# Patient Record
Sex: Female | Born: 1950
Health system: Southern US, Community
[De-identification: ages and names within clinical notes are randomized; demographics above are authoritative.]

## PROBLEM LIST (undated history)

## (undated) DIAGNOSIS — E7800 Pure hypercholesterolemia, unspecified: Secondary | ICD-10-CM

## (undated) DIAGNOSIS — R569 Unspecified convulsions: Secondary | ICD-10-CM

## (undated) HISTORY — PX: ABDOMINAL HYSTERECTOMY: SHX81

---

## 2015-10-19 ENCOUNTER — Other Ambulatory Visit: Payer: Self-pay | Admitting: Internal Medicine

## 2015-10-19 DIAGNOSIS — Z13820 Encounter for screening for osteoporosis: Secondary | ICD-10-CM | POA: Diagnosis not present

## 2015-10-19 DIAGNOSIS — Z1389 Encounter for screening for other disorder: Secondary | ICD-10-CM | POA: Diagnosis not present

## 2015-10-19 DIAGNOSIS — Z1231 Encounter for screening mammogram for malignant neoplasm of breast: Secondary | ICD-10-CM

## 2015-10-19 DIAGNOSIS — Z0000 Encounter for general adult medical examination without abnormal findings: Secondary | ICD-10-CM | POA: Diagnosis not present

## 2015-10-19 DIAGNOSIS — G40909 Epilepsy, unspecified, not intractable, without status epilepticus: Secondary | ICD-10-CM | POA: Diagnosis not present

## 2015-10-19 DIAGNOSIS — Z79899 Other long term (current) drug therapy: Secondary | ICD-10-CM | POA: Diagnosis not present

## 2015-10-19 DIAGNOSIS — Z23 Encounter for immunization: Secondary | ICD-10-CM | POA: Diagnosis not present

## 2015-11-20 ENCOUNTER — Ambulatory Visit: Payer: Self-pay

## 2015-12-18 ENCOUNTER — Encounter (HOSPITAL_COMMUNITY): Payer: Self-pay

## 2015-12-18 ENCOUNTER — Emergency Department (HOSPITAL_COMMUNITY)
Admission: EM | Admit: 2015-12-18 | Discharge: 2015-12-18 | Disposition: A | Discharge status: Home / Self Care | Payer: Commercial Managed Care - HMO | Attending: Emergency Medicine | Admitting: Emergency Medicine

## 2015-12-18 ENCOUNTER — Emergency Department (HOSPITAL_COMMUNITY): Payer: Commercial Managed Care - HMO

## 2015-12-18 DIAGNOSIS — R072 Precordial pain: Secondary | ICD-10-CM | POA: Diagnosis not present

## 2015-12-18 DIAGNOSIS — F17200 Nicotine dependence, unspecified, uncomplicated: Secondary | ICD-10-CM | POA: Diagnosis not present

## 2015-12-18 DIAGNOSIS — R079 Chest pain, unspecified: Secondary | ICD-10-CM

## 2015-12-18 LAB — BASIC METABOLIC PANEL
ANION GAP: 9 (ref 5–15)
BUN: 6 mg/dL (ref 6–20)
CALCIUM: 9.2 mg/dL (ref 8.9–10.3)
CHLORIDE: 102 mmol/L (ref 101–111)
CO2: 21 mmol/L — AB (ref 22–32)
Creatinine, Ser: 0.62 mg/dL (ref 0.44–1.00)
GFR calc Af Amer: >60 mL/min (ref >60)
GFR calc non Af Amer: >60 mL/min (ref >60)
GLUCOSE: 91 mg/dL (ref 65–99)
Potassium: 4 mmol/L (ref 3.5–5.1)
Sodium: 132 mmol/L — ABNORMAL LOW (ref 135–145)

## 2015-12-18 LAB — CBC
HEMATOCRIT: 38.4 % (ref 36.0–46.0)
HEMOGLOBIN: 13 g/dL (ref 12.0–15.0)
MCH: 33.7 pg (ref 26.0–34.0)
MCHC: 33.9 g/dL (ref 30.0–36.0)
MCV: 99.5 fL (ref 78.0–100.0)
Platelets: 354 10*3/uL (ref 150–400)
RBC: 3.86 MIL/uL — ABNORMAL LOW (ref 3.87–5.11)
RDW: 13.2 % (ref 11.5–15.5)
WBC: 7.3 10*3/uL (ref 4.0–10.5)

## 2015-12-18 LAB — I-STAT TROPONIN, ED
Troponin i, poc: 0 ng/mL (ref 0.00–0.08)
Troponin i, poc: 0 ng/mL (ref 0.00–0.08)

## 2015-12-18 NOTE — ED Provider Notes (Signed)
[facility] DEPT Provider Note   CSN: [id number] Arrival date & time: 12/18/15  1549     History   Chief Complaint Chief Complaint  Patient presents with  . Chest Pain    HPI Catherine Ferguson is a 65 y.o. female.   Chest Pain   This is a new problem. The current episode started 1 to 2 hours ago. The problem occurs rarely. The problem has been resolved. The pain is associated with rest. The pain is present in the substernal region. The pain is moderate. The quality of the pain is described as brief, sharp and stabbing. The pain radiates to the left neck and left shoulder. Duration of episode(s) is 6 minutes. Pertinent negatives include no abdominal pain, no back pain, no cough, no diaphoresis, no fever, no lower extremity edema, no nausea, no palpitations, no shortness of breath, no syncope and no vomiting. She has tried nothing for the symptoms. Risk factors include smoking/tobacco exposure.  Her past medical history is significant for seizures (Hx of epilepsy, on multiple medications).    History reviewed. No pertinent past medical history.  There are no active problems to display for this patient.   Past Surgical History:  Procedure Laterality Date  . ABDOMINAL HYSTERECTOMY      OB History    No data available       Home Medications    Prior to Admission medications   Medication Sig Start Date End Date Taking? Authorizing Provider  divalproex (DEPAKOTE) 500 MG DR tablet Take 500-1,000 mg by mouth See admin instructions. 500 mg in the morning then 1,000 mg at 1600 (4 PM) then 1,000 mg at bedtime   Yes Historical Provider, MD  docusate sodium (COLACE) 100 MG capsule Take 100 mg by mouth daily.   Yes Historical Provider, MD  gabapentin (NEURONTIN) 300 MG capsule Take 300-600 mg by mouth See admin instructions. 300 mg in the morning then 600 mg at 1600 (4 PM) then 600 mg at bedtime   Yes Historical Provider, MD  ibuprofen (ADVIL,MOTRIN) 200 MG tablet Take 400 mg by  mouth every 6 (six) hours as needed for headache (for pain or migraines).   Yes Historical Provider, MD  PHENObarbital (LUMINAL) 60 MG tablet Take 60 mg by mouth 3 (three) times daily.   Yes Historical Provider, MD    Family History History reviewed. No pertinent family history.  Social History Social History  Substance Use Topics  . Smoking status: Current Every Day Smoker  . Smokeless tobacco: Never Used  . Alcohol use No     Allergies   Review of patient's allergies indicates no known allergies.   Review of Systems Review of Systems  Constitutional: Negative for chills, diaphoresis and fever.  HENT: Negative for ear pain and sore throat.   Eyes: Negative for pain and visual disturbance.  Respiratory: Negative for cough and shortness of breath.   Cardiovascular: Positive for chest pain. Negative for palpitations and syncope.  Gastrointestinal: Negative for abdominal pain, nausea and vomiting.  Genitourinary: Negative for dysuria and hematuria.  Musculoskeletal: Negative for arthralgias and back pain.  Skin: Negative for color change and rash.  Neurological: Positive for seizures (Hx of epilepsy, on multiple medications). Negative for syncope.  All other systems reviewed and are negative.    Physical Exam Updated Vital Signs BP 123/77 (BP Location: Right Arm)   Pulse 72   Temp 98.9 F (37.2 C) (Oral)   Resp 16   Ht 5' 4.5" (1.638 m)   Wt  66.2 kg   SpO2 97%   BMI 24.67 kg/m   Physical Exam  Constitutional: She is oriented to person, place, and time. She appears well-developed and well-nourished. No distress.  HENT:  Head: Normocephalic and atraumatic.  Eyes: Conjunctivae and EOM are normal. Pupils are equal, round, and reactive to light.  Neck: Neck supple.  Cardiovascular: Normal rate and regular rhythm.   Pulmonary/Chest: Effort normal and breath sounds normal. No respiratory distress.  Abdominal: Soft. There is no tenderness.  Musculoskeletal: She exhibits  no edema.  Neurological: She is alert and oriented to person, place, and time.  Skin: Skin is warm and dry.  Psychiatric: She has a normal mood and affect.  Nursing note and vitals reviewed.    ED Treatments / Results  Labs (all labs ordered are listed, but only abnormal results are displayed) Labs Reviewed  BASIC METABOLIC PANEL - Abnormal; Notable for the following:       Result Value   Sodium 132 (*)    CO2 21 (*)    All other components within normal limits  CBC - Abnormal; Notable for the following:    RBC 3.86 (*)    All other components within normal limits  I-STAT TROPOININ, ED  I-STAT TROPOININ, ED    EKG  EKG Interpretation  Date/Time:  Monday December 18 2015 15:52:18 EDT Ventricular Rate:  72 PR Interval:    QRS Duration: 99 QT Interval:  385 QTC Calculation: 422 R Axis:   31 Text Interpretation:  Sinus rhythm Probable left atrial enlargement Low voltage, precordial leads Nonspecific ST changes No previous ECGs available Confirmed by [employer] MD, ERIN ([id number]) on 12/18/2015 5:03:48 PM       Radiology Dg Chest 2 View  Result Date: 12/18/2015 CLINICAL DATA:  Stabbing left chest pain for 5-7 minutes today. EXAM: CHEST  2 VIEW COMPARISON:  None. FINDINGS: There is peribronchial thickening and mild pulmonary hyperexpansion. No consolidative process, pneumothorax or effusion. Heart size is normal. Pectus deformity is noted. IMPRESSION: No acute disease. Appearance of the chest suggestive of COPD. Electronically Signed   By: Inge Rise M.D.   On: 12/18/2015 16:56    Procedures Procedures (including critical care time)  Medications Ordered in ED Medications - No data to display   Initial Impression / Assessment and Plan / ED Course  I have reviewed the triage vital signs and the nursing notes.  Pertinent labs & imaging results that were available during my care of the patient were reviewed by me and considered in my medical decision making (see chart  for details).  Clinical Course   ASA 324 by EMS. No nitroglycerin given. Chest pain resolved.  Catherine Ferguson is 65 year old female with past medical history of epilepsy and tobacco abuse who presents for acute chest pain.  She has had approximately 5 episodes of similar chest pain in the last year and most recently today.  Vital signs are stable.  EKG obtained, demonstrates sinus rhythm with low voltages, nonspecific T-wave changes.  Labs obtained including CBC BMP and delta troponin.  Results are unremarkable.  Chest x-ray obtained, personally reviewed by me, demonstrates no acute cardiac disease, but is consistent with COPD.  Patient's HEART score is 4.  I recommended admission for further cardiac workup.  Patient states she would prefer to obtain workup through her outpatient doctor.  She has good follow-up with Dr. Lavone Orn.  Through shared decision making the discussed the risks and benefits of discharge versus in-hospital workup.  Patient understands  the risks, but would prefer an outpatient workup.  Patient is discharged home with strict return precautions, follow up instructions, and educational materials.   Final Clinical Impressions(s) / ED Diagnoses   Final diagnoses:  Chest pain, unspecified type    New Prescriptions New Prescriptions   No medications on file     Elveria Rising, MD 12/19/15 [place], MD 12/27/15 1626

## 2015-12-18 NOTE — ED Notes (Signed)
Pt stable, ambulatory, states understanding of discharge instructions 

## 2015-12-18 NOTE — ED Triage Notes (Signed)
Pt arrived via EMS c/o left sided stabbing chest pain lasting 5-7 minutes.  EMS gave 324 ASA.  Denies any current pain

## 2015-12-21 ENCOUNTER — Ambulatory Visit
Admission: RE | Admit: 2015-12-21 | Discharge: 2015-12-21 | Disposition: A | Discharge status: Home / Self Care | Payer: Commercial Managed Care - HMO | Source: Ambulatory Visit | Attending: Internal Medicine | Admitting: Internal Medicine

## 2015-12-21 DIAGNOSIS — Z1231 Encounter for screening mammogram for malignant neoplasm of breast: Secondary | ICD-10-CM

## 2015-12-28 ENCOUNTER — Other Ambulatory Visit: Payer: Self-pay | Admitting: Internal Medicine

## 2015-12-28 DIAGNOSIS — R928 Other abnormal and inconclusive findings on diagnostic imaging of breast: Secondary | ICD-10-CM

## 2016-01-04 ENCOUNTER — Ambulatory Visit
Admission: RE | Admit: 2016-01-04 | Discharge: 2016-01-04 | Disposition: A | Discharge status: Home / Self Care | Payer: Commercial Managed Care - HMO | Source: Ambulatory Visit | Attending: Internal Medicine | Admitting: Internal Medicine

## 2016-01-04 ENCOUNTER — Other Ambulatory Visit: Payer: Self-pay | Admitting: Internal Medicine

## 2016-01-04 DIAGNOSIS — R921 Mammographic calcification found on diagnostic imaging of breast: Secondary | ICD-10-CM | POA: Diagnosis not present

## 2016-01-04 DIAGNOSIS — Z23 Encounter for immunization: Secondary | ICD-10-CM | POA: Diagnosis not present

## 2016-01-04 DIAGNOSIS — N6489 Other specified disorders of breast: Secondary | ICD-10-CM | POA: Diagnosis not present

## 2016-01-04 DIAGNOSIS — R928 Other abnormal and inconclusive findings on diagnostic imaging of breast: Secondary | ICD-10-CM

## 2016-01-10 ENCOUNTER — Ambulatory Visit
Admission: RE | Admit: 2016-01-10 | Discharge: 2016-01-10 | Disposition: A | Discharge status: Home / Self Care | Payer: Commercial Managed Care - HMO | Source: Ambulatory Visit | Attending: Internal Medicine | Admitting: Internal Medicine

## 2016-01-10 ENCOUNTER — Other Ambulatory Visit: Payer: Self-pay | Admitting: Internal Medicine

## 2016-01-10 DIAGNOSIS — R921 Mammographic calcification found on diagnostic imaging of breast: Secondary | ICD-10-CM

## 2016-01-10 DIAGNOSIS — D241 Benign neoplasm of right breast: Secondary | ICD-10-CM | POA: Diagnosis not present

## 2016-08-29 DIAGNOSIS — H524 Presbyopia: Secondary | ICD-10-CM | POA: Diagnosis not present

## 2016-10-22 DIAGNOSIS — E7800 Pure hypercholesterolemia, unspecified: Secondary | ICD-10-CM | POA: Diagnosis not present

## 2016-10-22 DIAGNOSIS — G40909 Epilepsy, unspecified, not intractable, without status epilepticus: Secondary | ICD-10-CM | POA: Diagnosis not present

## 2016-10-22 DIAGNOSIS — Z0000 Encounter for general adult medical examination without abnormal findings: Secondary | ICD-10-CM | POA: Diagnosis not present

## 2016-10-22 DIAGNOSIS — Z13820 Encounter for screening for osteoporosis: Secondary | ICD-10-CM | POA: Diagnosis not present

## 2016-10-22 DIAGNOSIS — Z23 Encounter for immunization: Secondary | ICD-10-CM | POA: Diagnosis not present

## 2017-06-19 DIAGNOSIS — B079 Viral wart, unspecified: Secondary | ICD-10-CM | POA: Diagnosis not present

## 2017-06-19 DIAGNOSIS — L84 Corns and callosities: Secondary | ICD-10-CM | POA: Diagnosis not present

## 2017-09-08 DIAGNOSIS — H524 Presbyopia: Secondary | ICD-10-CM | POA: Diagnosis not present

## 2017-09-13 DIAGNOSIS — H524 Presbyopia: Secondary | ICD-10-CM | POA: Diagnosis not present

## 2017-09-13 DIAGNOSIS — H52223 Regular astigmatism, bilateral: Secondary | ICD-10-CM | POA: Diagnosis not present

## 2017-10-28 ENCOUNTER — Ambulatory Visit
Admission: RE | Admit: 2017-10-28 | Discharge: 2017-10-28 | Disposition: A | Discharge status: Home / Self Care | Payer: Commercial Managed Care - HMO | Source: Ambulatory Visit | Attending: Internal Medicine | Admitting: Internal Medicine

## 2017-10-28 ENCOUNTER — Other Ambulatory Visit: Payer: Self-pay | Admitting: Internal Medicine

## 2017-10-28 DIAGNOSIS — M5442 Lumbago with sciatica, left side: Secondary | ICD-10-CM | POA: Diagnosis not present

## 2017-10-28 DIAGNOSIS — Z0000 Encounter for general adult medical examination without abnormal findings: Secondary | ICD-10-CM | POA: Diagnosis not present

## 2017-10-28 DIAGNOSIS — M5136 Other intervertebral disc degeneration, lumbar region: Secondary | ICD-10-CM | POA: Diagnosis not present

## 2017-10-28 DIAGNOSIS — Z720 Tobacco use: Secondary | ICD-10-CM | POA: Diagnosis not present

## 2017-10-28 DIAGNOSIS — E7800 Pure hypercholesterolemia, unspecified: Secondary | ICD-10-CM | POA: Diagnosis not present

## 2017-10-28 DIAGNOSIS — L84 Corns and callosities: Secondary | ICD-10-CM | POA: Diagnosis not present

## 2017-10-28 DIAGNOSIS — Z1159 Encounter for screening for other viral diseases: Secondary | ICD-10-CM | POA: Diagnosis not present

## 2017-10-28 DIAGNOSIS — G40909 Epilepsy, unspecified, not intractable, without status epilepticus: Secondary | ICD-10-CM | POA: Diagnosis not present

## 2017-10-28 DIAGNOSIS — G8929 Other chronic pain: Secondary | ICD-10-CM | POA: Diagnosis not present

## 2017-10-28 DIAGNOSIS — Z1389 Encounter for screening for other disorder: Secondary | ICD-10-CM | POA: Diagnosis not present

## 2017-10-28 DIAGNOSIS — M5441 Lumbago with sciatica, right side: Secondary | ICD-10-CM | POA: Diagnosis not present

## 2017-10-29 ENCOUNTER — Ambulatory Visit: Payer: Medicare HMO | Admitting: Podiatry

## 2017-10-29 ENCOUNTER — Encounter: Payer: Self-pay | Admitting: Podiatry

## 2017-10-29 DIAGNOSIS — M216X2 Other acquired deformities of left foot: Secondary | ICD-10-CM

## 2017-10-29 DIAGNOSIS — Q828 Other specified congenital malformations of skin: Secondary | ICD-10-CM

## 2017-10-29 NOTE — Progress Notes (Signed)
This patient presents the office with chief complaint of a painful skin lesion on the bottom of her left foot.  She says the skin lesion has been present for approximately 2 months.  She says that it is painful walking and wearing her shoes.  She has a history of a previous skin lesion years ago.  She says that she has trim down her callus and wearing pads on her left forefoot.  She was referred to this office by her medical doctor.  She presents to the office for evaluation and treatment of this painful skin lesion.  General Appearance  Alert, conversant and in no acute stress.  Vascular  Dorsalis pedis and posterior tibial  pulses are palpable  bilaterally.  Capillary return is within normal limits  bilaterally. Temperature is within normal limits  bilaterally.  Neurologic  Senn-Weinstein monofilament wire test within normal limits  bilaterally. Muscle power within normal limits bilaterally.  Nails Thick disfigured discolored nails with subungual debris  from hallux to fifth toes bilaterally. No evidence of bacterial infection or drainage bilaterally.  Orthopedic  No limitations of motion of motion feet .  No crepitus or effusions noted.  No bony pathology or digital deformities noted. Plantarflexed fifth metatarsal head left foot.  Skin  normotropic skin  noted bilaterally.  No signs of infections or ulcers noted. Porokeratosis sub 5th metatarsal left foot. White tissue noted in the center of skin lesion.  Porokeratosis secondary to plantarflexed metatarsal left foot.  IE  Debride porokeratosis.  Discussed  this skin lesion with this patient . Told this patient. I believe she has developed a poor keratoses and not a wart.  I believe the white  tissue in the skin lesion is  scar tissue .  Adding was dispensed for this patient to take pressure off the skin lesion during gait.  We discussed surgery versus acid treatment if this problem persists.  RTC prn.   Gardiner Barefoot DPM

## 2017-12-02 DIAGNOSIS — Z23 Encounter for immunization: Secondary | ICD-10-CM | POA: Diagnosis not present

## 2017-12-30 DIAGNOSIS — Z79899 Other long term (current) drug therapy: Secondary | ICD-10-CM | POA: Diagnosis not present

## 2017-12-30 DIAGNOSIS — E7800 Pure hypercholesterolemia, unspecified: Secondary | ICD-10-CM | POA: Diagnosis not present

## 2018-01-16 ENCOUNTER — Other Ambulatory Visit: Payer: Self-pay

## 2018-01-16 ENCOUNTER — Emergency Department (HOSPITAL_COMMUNITY)
Admission: EM | Admit: 2018-01-16 | Discharge: 2018-01-16 | Disposition: A | Discharge status: Home / Self Care | Payer: Medicare HMO | Attending: Emergency Medicine | Admitting: Emergency Medicine

## 2018-01-16 ENCOUNTER — Encounter (HOSPITAL_COMMUNITY): Payer: Self-pay

## 2018-01-16 ENCOUNTER — Emergency Department (HOSPITAL_COMMUNITY): Payer: Medicare HMO

## 2018-01-16 DIAGNOSIS — Y9389 Activity, other specified: Secondary | ICD-10-CM | POA: Insufficient documentation

## 2018-01-16 DIAGNOSIS — R109 Unspecified abdominal pain: Secondary | ICD-10-CM | POA: Diagnosis not present

## 2018-01-16 DIAGNOSIS — S92301A Fracture of unspecified metatarsal bone(s), right foot, initial encounter for closed fracture: Secondary | ICD-10-CM

## 2018-01-16 DIAGNOSIS — S92511A Displaced fracture of proximal phalanx of right lesser toe(s), initial encounter for closed fracture: Secondary | ICD-10-CM | POA: Diagnosis not present

## 2018-01-16 DIAGNOSIS — F17200 Nicotine dependence, unspecified, uncomplicated: Secondary | ICD-10-CM | POA: Diagnosis not present

## 2018-01-16 DIAGNOSIS — S8991XA Unspecified injury of right lower leg, initial encounter: Secondary | ICD-10-CM | POA: Diagnosis not present

## 2018-01-16 DIAGNOSIS — S92341A Displaced fracture of fourth metatarsal bone, right foot, initial encounter for closed fracture: Secondary | ICD-10-CM | POA: Diagnosis not present

## 2018-01-16 DIAGNOSIS — Y92410 Unspecified street and highway as the place of occurrence of the external cause: Secondary | ICD-10-CM | POA: Diagnosis not present

## 2018-01-16 DIAGNOSIS — R079 Chest pain, unspecified: Secondary | ICD-10-CM | POA: Diagnosis not present

## 2018-01-16 DIAGNOSIS — M79661 Pain in right lower leg: Secondary | ICD-10-CM | POA: Diagnosis not present

## 2018-01-16 DIAGNOSIS — S92911A Unspecified fracture of right toe(s), initial encounter for closed fracture: Secondary | ICD-10-CM | POA: Insufficient documentation

## 2018-01-16 DIAGNOSIS — R0789 Other chest pain: Secondary | ICD-10-CM | POA: Diagnosis not present

## 2018-01-16 DIAGNOSIS — S299XXA Unspecified injury of thorax, initial encounter: Secondary | ICD-10-CM | POA: Diagnosis not present

## 2018-01-16 DIAGNOSIS — Y999 Unspecified external cause status: Secondary | ICD-10-CM | POA: Diagnosis not present

## 2018-01-16 DIAGNOSIS — S92331A Displaced fracture of third metatarsal bone, right foot, initial encounter for closed fracture: Secondary | ICD-10-CM | POA: Diagnosis not present

## 2018-01-16 DIAGNOSIS — R52 Pain, unspecified: Secondary | ICD-10-CM

## 2018-01-16 HISTORY — DX: Unspecified convulsions: R56.9

## 2018-01-16 HISTORY — DX: Pure hypercholesterolemia, unspecified: E78.00

## 2018-01-16 LAB — I-STAT CHEM 8, ED
BUN: 7 mg/dL — AB (ref 8–23)
CHLORIDE: 104 mmol/L (ref 98–111)
Calcium, Ion: 1.12 mmol/L — ABNORMAL LOW (ref 1.15–1.40)
Creatinine, Ser: 0.6 mg/dL (ref 0.44–1.00)
GLUCOSE: 97 mg/dL (ref 70–99)
HCT: 38 % (ref 36.0–46.0)
Hemoglobin: 12.9 g/dL (ref 12.0–15.0)
POTASSIUM: 3.4 mmol/L — AB (ref 3.5–5.1)
Sodium: 139 mmol/L (ref 135–145)
TCO2: 26 mmol/L (ref 22–32)

## 2018-01-16 MED ORDER — HYDROCODONE-ACETAMINOPHEN 5-325 MG PO TABS: 1.0000 | ORAL_TABLET | ORAL | 0 refills | Status: DC | PRN

## 2018-01-16 MED ORDER — HYDROCODONE-ACETAMINOPHEN 5-325 MG PO TABS
1.0000 | ORAL_TABLET | Freq: Once | ORAL | Status: AC
  Administered 2018-01-16: 1 via ORAL
  Filled 2018-01-16: qty 1

## 2018-01-16 NOTE — ED Notes (Signed)
Patient transported to X-ray 

## 2018-01-16 NOTE — ED Notes (Signed)
Took patient saline lock out patient is getting dressed °
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
## 2018-01-16 NOTE — Discharge Instructions (Signed)
Please do not bear weight on your right foot and use the crutches and postop shoe anytime you are getting around until you follow-up with orthopedics and are told differently.  Please use the pain medication sparingly and try to use only Tylenol or ibuprofen if possible.

## 2018-01-16 NOTE — ED Notes (Signed)
Pt given crutches ace wrap foot left and post op shoe
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
## 2018-01-16 NOTE — Progress Notes (Signed)
Orthopedic Tech Progress Note Patient Details:  Catherine Ferguson 03-Feb-1951 [id number]  Ortho Devices Type of Ortho Device: Crutches Ortho Device/Splint Interventions: Adjustment   Post Interventions Patient Tolerated: Well Instructions Provided: Adjustment of device, Care of device   Melony Overly T 01/16/2018, 12:32 PM

## 2018-01-21 DIAGNOSIS — M79674 Pain in right toe(s): Secondary | ICD-10-CM | POA: Diagnosis not present

## 2018-01-21 DIAGNOSIS — S92301A Fracture of unspecified metatarsal bone(s), right foot, initial encounter for closed fracture: Secondary | ICD-10-CM | POA: Diagnosis not present

## 2018-03-17 DIAGNOSIS — M79674 Pain in right toe(s): Secondary | ICD-10-CM | POA: Diagnosis not present

## 2018-03-17 DIAGNOSIS — S92301D Fracture of unspecified metatarsal bone(s), right foot, subsequent encounter for fracture with routine healing: Secondary | ICD-10-CM | POA: Diagnosis not present

## 2018-04-10 ENCOUNTER — Other Ambulatory Visit: Payer: Self-pay | Admitting: Internal Medicine

## 2018-04-10 DIAGNOSIS — Z13820 Encounter for screening for osteoporosis: Secondary | ICD-10-CM

## 2018-06-04 ENCOUNTER — Other Ambulatory Visit: Payer: Medicare HMO

## 2018-09-02 ENCOUNTER — Other Ambulatory Visit: Payer: Self-pay | Admitting: Pediatric Intensive Care

## 2018-09-02 DIAGNOSIS — Z20822 Contact with and (suspected) exposure to covid-19: Secondary | ICD-10-CM

## 2018-09-07 LAB — NOVEL CORONAVIRUS, NAA: SARS-CoV-2, NAA: NOT DETECTED

## 2018-09-10 DIAGNOSIS — H524 Presbyopia: Secondary | ICD-10-CM | POA: Diagnosis not present

## 2018-10-14 ENCOUNTER — Other Ambulatory Visit: Payer: Medicare HMO

## 2018-11-03 ENCOUNTER — Ambulatory Visit
Admission: RE | Admit: 2018-11-03 | Discharge: 2018-11-03 | Disposition: A | Discharge status: Home / Self Care | Payer: Medicare HMO | Source: Ambulatory Visit | Attending: Internal Medicine | Admitting: Internal Medicine

## 2018-11-03 ENCOUNTER — Other Ambulatory Visit: Payer: Self-pay

## 2018-11-03 DIAGNOSIS — Z13820 Encounter for screening for osteoporosis: Secondary | ICD-10-CM

## 2018-11-03 DIAGNOSIS — M810 Age-related osteoporosis without current pathological fracture: Secondary | ICD-10-CM | POA: Diagnosis not present

## 2018-11-03 DIAGNOSIS — Z780 Asymptomatic menopausal state: Secondary | ICD-10-CM | POA: Diagnosis not present

## 2018-11-06 DIAGNOSIS — Z1389 Encounter for screening for other disorder: Secondary | ICD-10-CM | POA: Diagnosis not present

## 2018-11-06 DIAGNOSIS — Z0000 Encounter for general adult medical examination without abnormal findings: Secondary | ICD-10-CM | POA: Diagnosis not present

## 2018-12-09 DIAGNOSIS — Z23 Encounter for immunization: Secondary | ICD-10-CM | POA: Diagnosis not present

## 2018-12-09 DIAGNOSIS — Z1211 Encounter for screening for malignant neoplasm of colon: Secondary | ICD-10-CM | POA: Diagnosis not present

## 2018-12-09 DIAGNOSIS — G40909 Epilepsy, unspecified, not intractable, without status epilepticus: Secondary | ICD-10-CM | POA: Diagnosis not present

## 2018-12-09 DIAGNOSIS — E7800 Pure hypercholesterolemia, unspecified: Secondary | ICD-10-CM | POA: Diagnosis not present

## 2018-12-09 DIAGNOSIS — M818 Other osteoporosis without current pathological fracture: Secondary | ICD-10-CM | POA: Diagnosis not present

## 2018-12-17 DIAGNOSIS — G40909 Epilepsy, unspecified, not intractable, without status epilepticus: Secondary | ICD-10-CM | POA: Diagnosis not present

## 2018-12-31 ENCOUNTER — Ambulatory Visit: Payer: Medicare HMO | Admitting: Podiatry

## 2019-02-15 IMAGING — CR DG LUMBAR SPINE 2-3V
3 series · 3 of 3 positions shown · non-contrast
Comparison: None.

CLINICAL DATA: Low back and bilateral hip pain greatest on the
right. No known injury

EXAM:
LUMBAR SPINE - 2-3 VIEW

[t l-spine a.p.]
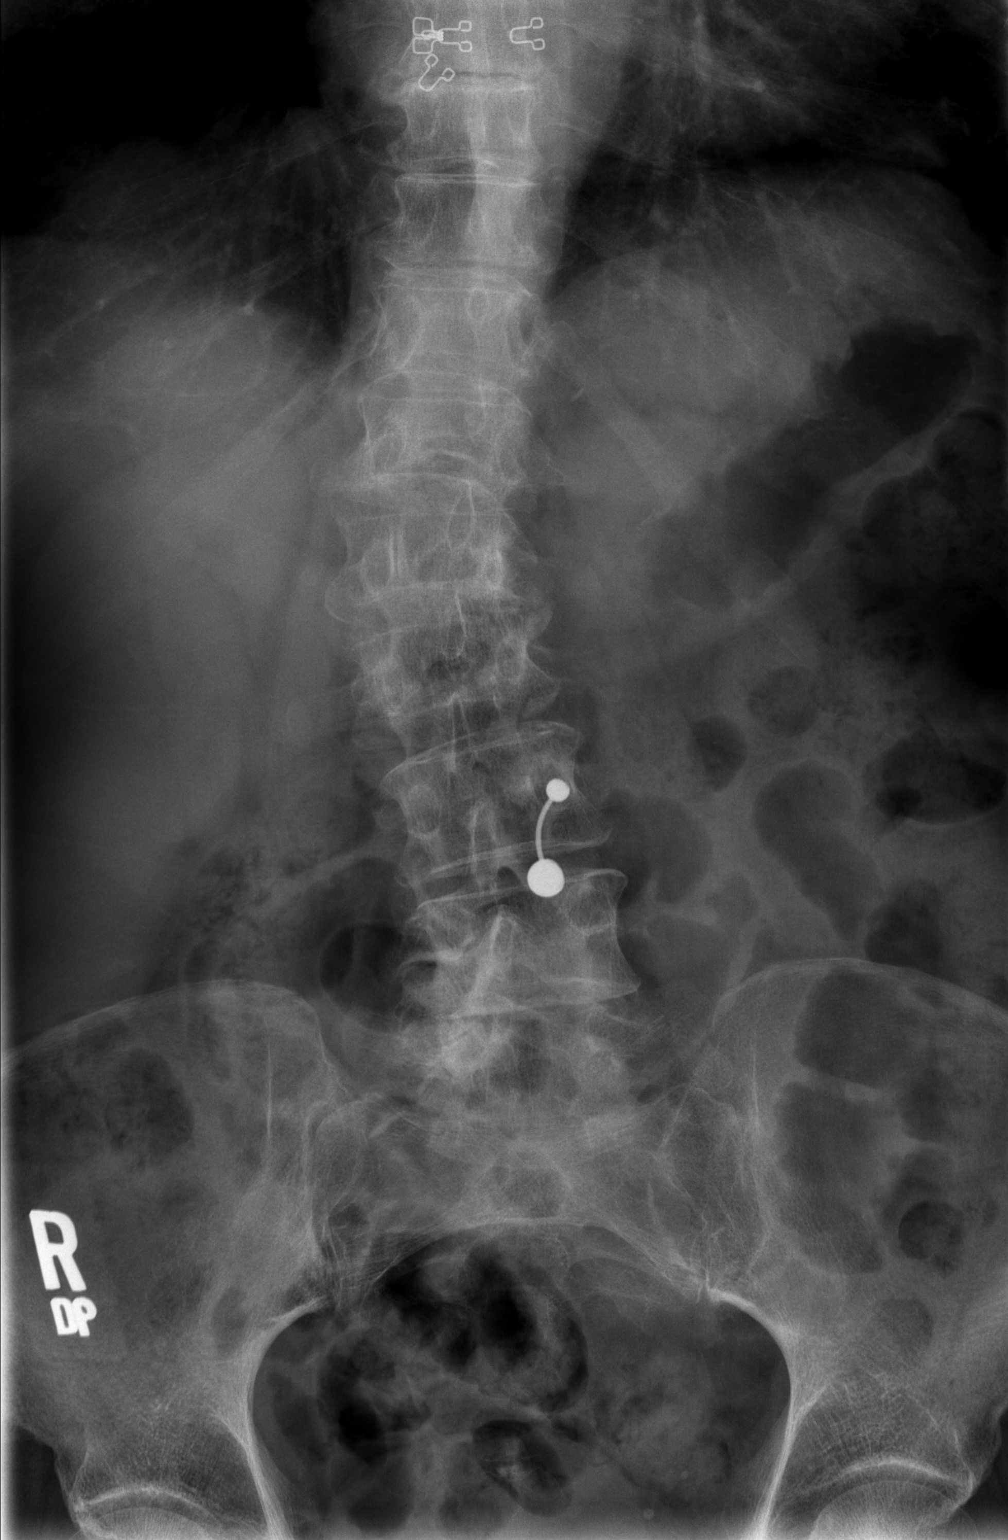

[t l-spine lat]
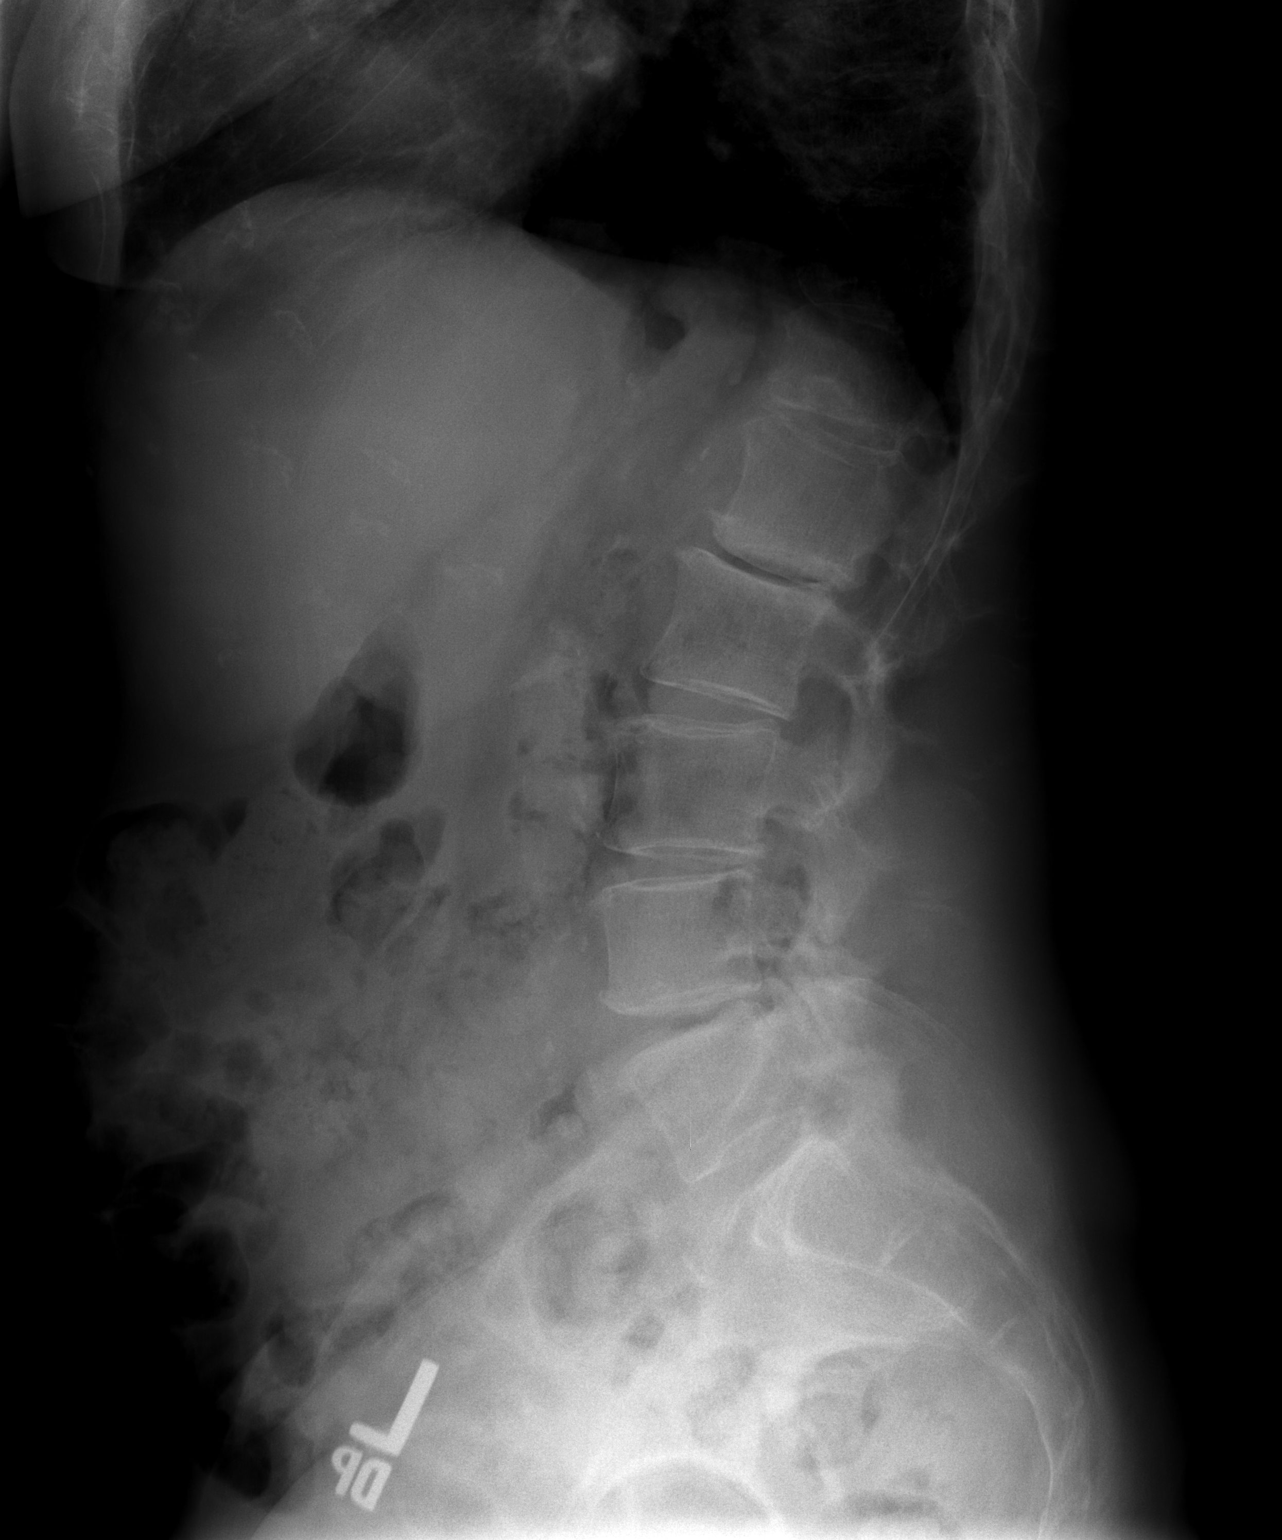

[t l-spine l5-s1 spot]
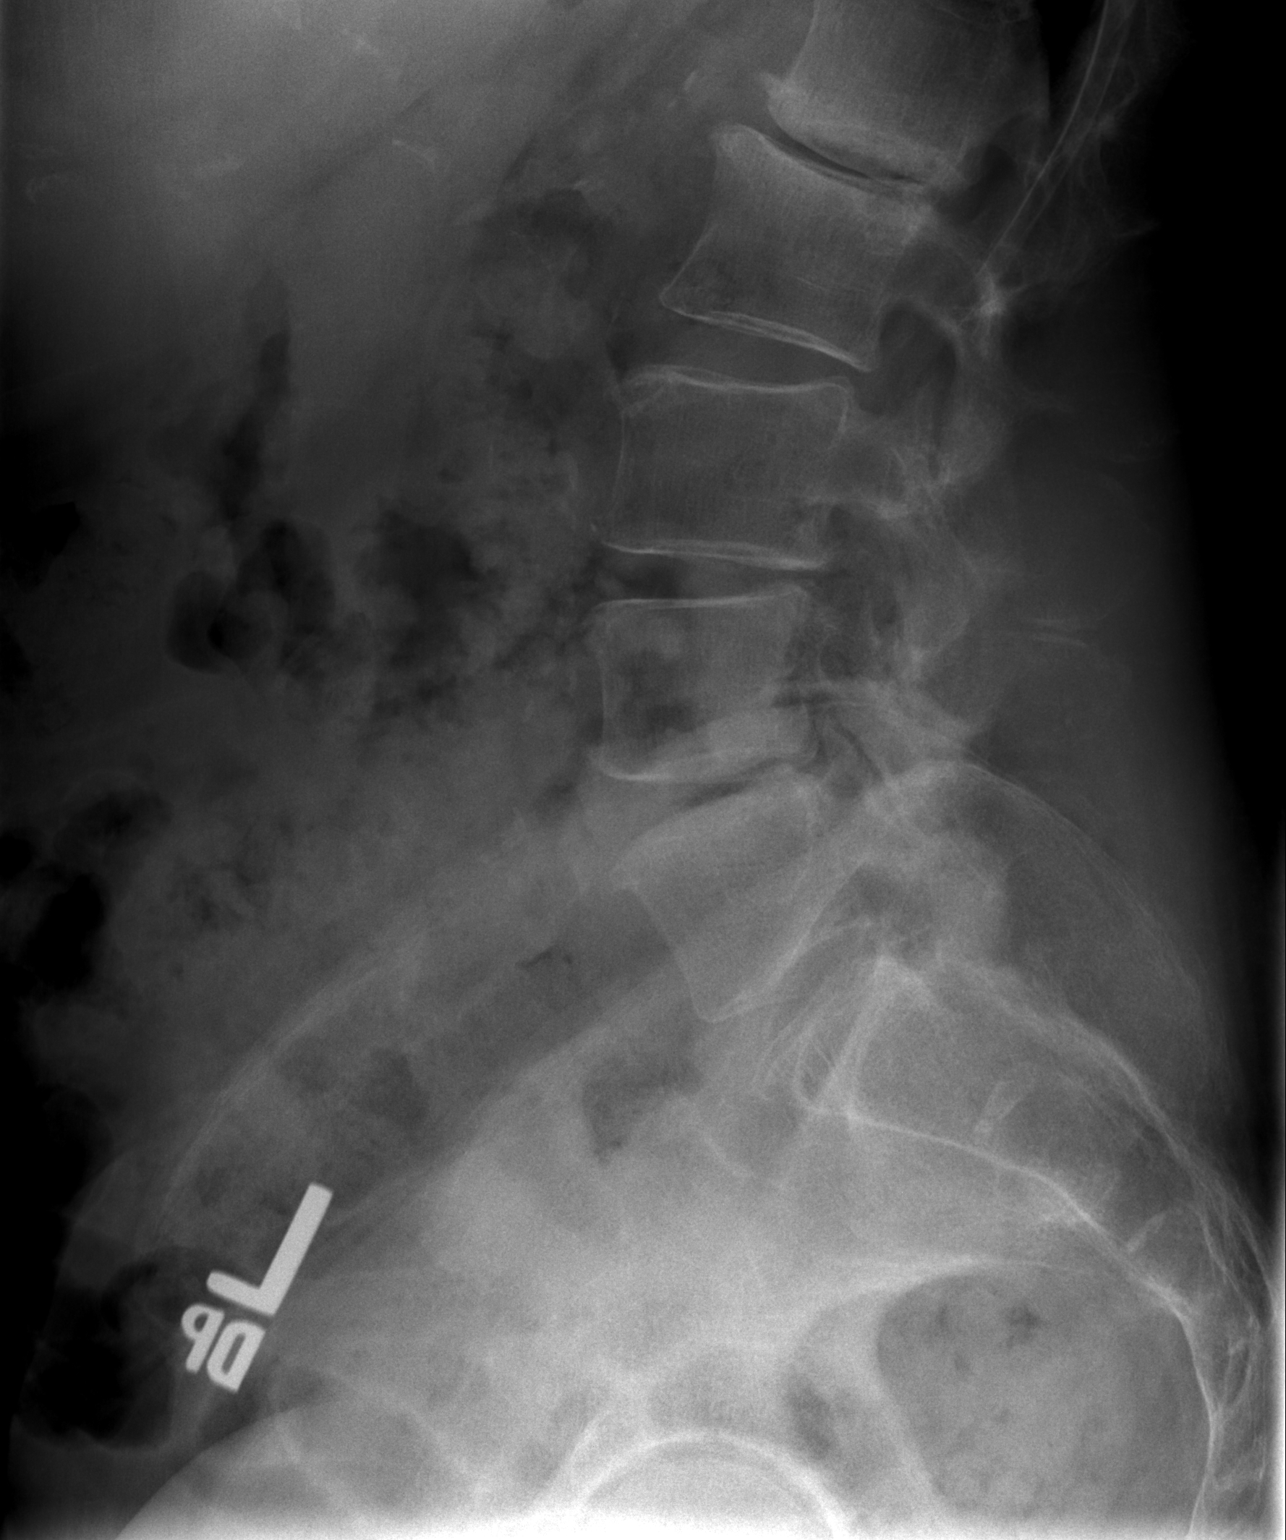

[3 of 3 positions shown; findings below may reference images not displayed]

FINDINGS: The lumbar vertebral bodies are preserved in height. There is mild
disc space narrowing at L1-2, L3-4, and L4-5. There is mild facet
joint hypertrophy at L4-5 and L5-S1. There is moderate
dextrocurvature centered at L2. The pedicles are grossly intact.
IMPRESSION: Moderate degenerative disc disease at multiple levels. No
compression fracture or spondylolisthesis. Moderate dextrocurvature
of the thoracolumbar spine is centered at L2.

## 2019-03-23 ENCOUNTER — Ambulatory Visit (INDEPENDENT_AMBULATORY_CARE_PROVIDER_SITE_OTHER): Payer: Medicare HMO

## 2019-03-23 ENCOUNTER — Other Ambulatory Visit: Payer: Self-pay

## 2019-03-23 ENCOUNTER — Encounter: Payer: Self-pay | Admitting: Orthopaedic Surgery

## 2019-03-23 ENCOUNTER — Telehealth: Payer: Self-pay | Admitting: Physician Assistant

## 2019-03-23 ENCOUNTER — Ambulatory Visit: Payer: Medicare HMO | Admitting: Orthopaedic Surgery

## 2019-03-23 DIAGNOSIS — M25572 Pain in left ankle and joints of left foot: Secondary | ICD-10-CM

## 2019-03-23 DIAGNOSIS — S92302A Fracture of unspecified metatarsal bone(s), left foot, initial encounter for closed fracture: Secondary | ICD-10-CM

## 2019-03-23 MED ORDER — HYDROCODONE-ACETAMINOPHEN 5-325 MG PO TABS: 1.0000 | ORAL_TABLET | ORAL | 0 refills | Status: DC | PRN

## 2019-03-23 NOTE — Telephone Encounter (Signed)
[facility] [address] called stated a Rx was sent for Hydrocodone  If: Chronic ok -but need diagnosis Acute issues the dosage needs lowered due to CDC guidelines

## 2019-03-23 NOTE — Telephone Encounter (Signed)
This is actually for an acute fracture of her foot.  It is only 20 tablets.  They can certainly change it to every 6 hours as needed.

## 2019-03-23 NOTE — Telephone Encounter (Signed)
Called pharmacy
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
## 2019-04-06 ENCOUNTER — Ambulatory Visit (INDEPENDENT_AMBULATORY_CARE_PROVIDER_SITE_OTHER): Payer: Medicare HMO

## 2019-04-06 ENCOUNTER — Ambulatory Visit (INDEPENDENT_AMBULATORY_CARE_PROVIDER_SITE_OTHER): Payer: Medicare HMO | Admitting: Orthopaedic Surgery

## 2019-04-06 ENCOUNTER — Encounter: Payer: Self-pay | Admitting: Orthopaedic Surgery

## 2019-04-06 ENCOUNTER — Other Ambulatory Visit: Payer: Self-pay

## 2019-04-06 DIAGNOSIS — S92302A Fracture of unspecified metatarsal bone(s), left foot, initial encounter for closed fracture: Secondary | ICD-10-CM

## 2019-04-06 MED ORDER — HYDROCODONE-ACETAMINOPHEN 5-325 MG PO TABS: 1.0000 | ORAL_TABLET | ORAL | 0 refills | Status: DC | PRN

## 2019-04-06 NOTE — Progress Notes (Signed)
Office Visit Note   Patient: Catherine Ferguson           Date of Birth: [birth year]           MRN: [id number] Visit Date: 04/06/2019              Requested by: Lavone Orn, MD [address] [place],  [place] [address] PCP: Lavone Orn, MD   Assessment & Plan: Visit Diagnoses:  1. Closed displaced fracture of metatarsal bone of left foot, unspecified metatarsal, initial encounter     Plan: She will come out of the boot for gentle range of motion ankle and wiggling toes.  She is weightbearing as tolerated in cam walker boot.  Discussed with her smoking cessation she has cut back on her smoking.  Refill on her hydrocodone.  Follow-up in 1 month 3 views of the left foot at that time.  Questions encouraged and answered  Follow-Up Instructions: Return in about 4 weeks (around 05/04/2019) for Radiographs.   Orders:  Orders Placed This Encounter  Procedures  . XR Foot Complete Left   Meds ordered this encounter  Medications  . HYDROcodone-acetaminophen (NORCO/VICODIN) 5-325 MG tablet    Sig: Take 1-2 tablets by mouth every 4 (four) hours as needed for severe pain.    Dispense:  25 tablet    Refill:  0      Procedures: No procedures performed   Clinical Data: No additional findings.   Subjective: Chief Complaint  Patient presents with  . Left Foot - Follow-up    HPI Catherine Ferguson returns today follow-up of her left fifth metatarsal base fracture.  She has pain mostly at night and is when she takes hydrocodone.  She is asking for refill on her hydrocodone today.  Again she injured her right foot and ankle on New Year's Eve was found to have a base fifth metatarsal fracture.  Has been weightbearing as tolerated cam walker boot.  Notes her swelling is decreasing. Review of Systems Negative for fevers chills shortness of breath chest pain  Objective: Vital Signs: There were no vitals taken for this visit.  Physical Exam Constitutional:    Appearance: She is not ill-appearing or diaphoretic.  Pulmonary:     Effort: Pulmonary effort is normal.  Neurological:     Mental Status: She is alert and oriented to person, place, and time.     Ortho Exam Left foot tenderness over the metatarsal base.  No impending ulcers rashes or skin lesions.  Good range of motion of the left ankle dorsiflexion plantarflexion.  Left calf supple nontender.  Dorsal pedal pulses 2+. Specialty Comments:  No specialty comments available.  Imaging: XR Foot Complete Left  Result Date: 04/06/2019 Left foot 3 views: Shows base fifth zone 2 metatarsal fracture remains in overall good position alignment.  Some early signs of consolidation are seen.  No other obvious fractures.    PMFS History: Patient Active Problem List   Diagnosis Date Noted  . Closed displaced fracture of metatarsal bone of left foot 03/23/2019   Past Medical History:  Diagnosis Date  . High cholesterol   . Seizures ([place])     History reviewed. No pertinent family history.  Past Surgical History:  Procedure Laterality Date  . ABDOMINAL HYSTERECTOMY     Social History   Occupational History  . Not on file  Tobacco Use  . Smoking status: Current Every Day Smoker  . Smokeless tobacco: Never Used  Substance and Sexual Activity  .  Alcohol use: No  . Drug use: No  . Sexual activity: Not on file

## 2019-05-04 ENCOUNTER — Other Ambulatory Visit: Payer: Self-pay

## 2019-05-04 ENCOUNTER — Encounter: Payer: Self-pay | Admitting: Orthopaedic Surgery

## 2019-05-04 ENCOUNTER — Ambulatory Visit: Payer: Medicare HMO | Admitting: Orthopaedic Surgery

## 2019-05-04 ENCOUNTER — Telehealth: Payer: Self-pay | Admitting: Orthopaedic Surgery

## 2019-05-04 ENCOUNTER — Ambulatory Visit (INDEPENDENT_AMBULATORY_CARE_PROVIDER_SITE_OTHER): Payer: Medicare HMO

## 2019-05-04 DIAGNOSIS — S92302A Fracture of unspecified metatarsal bone(s), left foot, initial encounter for closed fracture: Secondary | ICD-10-CM

## 2019-05-04 MED ORDER — HYDROCODONE-ACETAMINOPHEN 5-325 MG PO TABS: 1.0000 | ORAL_TABLET | Freq: Four times a day (QID) | ORAL | 0 refills | Status: DC | PRN

## 2019-05-04 NOTE — Telephone Encounter (Signed)
Patient called and stated that needed a refill of Hydrocodone.  Please call patient to advise.  [phone]

## 2019-05-04 NOTE — Progress Notes (Signed)
The patient is a 69 year old female smoker who is now about 7 weeks into fractures of her left foot fourth and fifth metatarsals.  She does report that she is doing well overall.  She has been walking in a cam walking boot.  She has a cane in her opposite hand.  She reports foot numbness only when she elevates her foot.  On exam she still has some mild pain over the base of the fifth metatarsal and some of the fourth metatarsal on her left foot but otherwise her exam she has minimal swelling and a well-perfused foot.  3 views left foot are obtained and it looks like both metatarsal fractures of the fourth and fifth metatarsals are healing of the left foot.  I again counseled her about smoking cessation.  She understands this is going to take a long period time to heal due to her being a smoker.  I would not recommend any type of surgical intervention in her foot but because I do feel this will either heal or become a functional nonunion.  Regardless with her being a smoker I would not recommend any further surgical intervention.  We will see if she wants to transition to a postop shoe today.  From my standpoint we can see her back in about 6 weeks with repeat 3 views of her left foot.  All question concerns were answered and addressed.

## 2019-05-04 NOTE — Telephone Encounter (Signed)
Please advise 

## 2019-05-05 ENCOUNTER — Telehealth: Payer: Self-pay | Admitting: Orthopaedic Surgery

## 2019-05-05 MED ORDER — HYDROCODONE-ACETAMINOPHEN 5-325 MG PO TABS: 1.0000 | ORAL_TABLET | Freq: Four times a day (QID) | ORAL | 0 refills | Status: AC | PRN

## 2019-05-05 NOTE — Telephone Encounter (Signed)
Patient called requesting updates on refill of hydrocodone refill. Please send to pharmacy on file. Patient request a call back about medication being called in to pharmacy. Patient phone number is [phone].

## 2019-05-05 NOTE — Telephone Encounter (Signed)
Please advise 

## 2019-05-05 NOTE — Telephone Encounter (Signed)
I sent it in 

## 2019-05-06 ENCOUNTER — Telehealth: Payer: Self-pay | Admitting: Orthopaedic Surgery

## 2019-05-06 NOTE — Telephone Encounter (Signed)
Pharmacy called.  They think the patient's last prescription was sent in error. Need a call back to verify   Call back number: [phone]

## 2019-06-08 DIAGNOSIS — M818 Other osteoporosis without current pathological fracture: Secondary | ICD-10-CM | POA: Diagnosis not present

## 2019-06-15 ENCOUNTER — Ambulatory Visit: Payer: Medicare HMO | Admitting: Orthopaedic Surgery

## 2019-06-15 ENCOUNTER — Ambulatory Visit (INDEPENDENT_AMBULATORY_CARE_PROVIDER_SITE_OTHER): Payer: Medicare HMO

## 2019-06-15 ENCOUNTER — Other Ambulatory Visit: Payer: Self-pay

## 2019-06-15 ENCOUNTER — Encounter: Payer: Self-pay | Admitting: Orthopaedic Surgery

## 2019-06-15 DIAGNOSIS — S92302A Fracture of unspecified metatarsal bone(s), left foot, initial encounter for closed fracture: Secondary | ICD-10-CM

## 2019-06-15 NOTE — Progress Notes (Signed)
   Office Visit Note   Patient: Catherine Ferguson           Date of Birth: March 29, 1950           MRN: [id number] Visit Date: 06/15/2019              Requested by: Lavone Orn, MD [address] [place],  [place] [address] PCP: Lavone Orn, MD   Assessment & Plan: Visit Diagnoses:  1. Closed displaced fracture of metatarsal bone of left foot, unspecified metatarsal, initial encounter     Plan: She will continue the postop shoe for another month.  Then transition into a regular shoe as tolerated.  No high impact activities.  Follow-up with [place] in 3 months 3 views of the left foot at that time.  Questions encouraged and answered.  Recommended use of Voltaren gel over the first MP joint left foot.  Follow-Up Instructions: No follow-ups on file.   Orders:  Orders Placed This Encounter  Procedures  . XR Foot Complete Left   No orders of the defined types were placed in this encounter.     Procedures: No procedures performed   Clinical Data: No additional findings.   Subjective: Chief Complaint  Patient presents with  . Left Foot - Follow-up    HPI Catherine Ferguson returns today for follow-up of her left foot fourth and fifth metatarsal fractures.  She states overall that she has pain first thing in the morning.  However her foot does feel as if it is improving in regards to pain.  She continues to wear a postop shoe and is using a cane in her right hand to ambulate.  No new injury. Review of Systems   Objective: Vital Signs: There were no vitals taken for this visit.  Physical Exam  Ortho Exam Left foot tenderness over the base of the fifth metatarsal.  No tenderness over the fourth metatarsal proximal shaft region.  Has full dorsiflexion plantarflexion ankle.  Good inversion eversion of the left foot pain with eversion only.  Tenderness over the first MP joint and pain with range of motion. Specialty Comments:  No specialty comments  available.  Imaging: XR Foot Complete Left  Result Date: 06/15/2019 3 views left foot: Fourth and fifth metatarsal fractures show further consolidation from prior films.  No change in overall position alignment.  MP joint arthritic changes noted.  No acute fractures otherwise.    PMFS History: Patient Active Problem List   Diagnosis Date Noted  . Closed displaced fracture of metatarsal bone of left foot 03/23/2019   Past Medical History:  Diagnosis Date  . High cholesterol   . Seizures ([place])     No family history on file.  Past Surgical History:  Procedure Laterality Date  . ABDOMINAL HYSTERECTOMY     Social History   Occupational History  . Not on file  Tobacco Use  . Smoking status: Current Every Day Smoker  . Smokeless tobacco: Never Used  Substance and Sexual Activity  . Alcohol use: No  . Drug use: No  . Sexual activity: Not on file

## 2019-07-02 ENCOUNTER — Other Ambulatory Visit: Payer: Self-pay | Admitting: Internal Medicine

## 2019-07-02 DIAGNOSIS — R921 Mammographic calcification found on diagnostic imaging of breast: Secondary | ICD-10-CM

## 2019-09-14 ENCOUNTER — Encounter: Payer: Self-pay | Admitting: Orthopaedic Surgery

## 2019-09-14 ENCOUNTER — Ambulatory Visit (INDEPENDENT_AMBULATORY_CARE_PROVIDER_SITE_OTHER): Payer: Medicare HMO

## 2019-09-14 ENCOUNTER — Ambulatory Visit: Payer: Medicare HMO | Admitting: Orthopaedic Surgery

## 2019-09-14 ENCOUNTER — Other Ambulatory Visit: Payer: Self-pay

## 2019-09-14 DIAGNOSIS — S92302A Fracture of unspecified metatarsal bone(s), left foot, initial encounter for closed fracture: Secondary | ICD-10-CM

## 2019-09-14 NOTE — Progress Notes (Signed)
The patient is now at about 6 months after sustaining fractures to her left foot fourth and fifth metatarsals near the bases.  She is 69 years old and a chronic smoker.  She has been walking in a postoperative shoe and it has taken a while for this to heal.  She actually brought me new tennis shoes to show today to make sure the use of the right firmness for shoes to wear.  On exam out of her postop shoe there is no pain over the fourth and fifth metatarsals and they feel solid.  Her foot exam is normal.  There is no significant swelling.  3 views of the left foot show that the metatarsal fractures have completely healed.  I showed her these compared to films from January of this year.  At this point she can transition back to her regular shoes and I do feel that the shoes that she has brought into the office today are nice and firm shoes.  She is taking calcium supplements.  All questions and concerns were answered and addressed.  At this point follow-up can be as needed.

## 2019-10-25 DIAGNOSIS — H524 Presbyopia: Secondary | ICD-10-CM | POA: Diagnosis not present

## 2019-11-12 DIAGNOSIS — K5903 Drug induced constipation: Secondary | ICD-10-CM | POA: Diagnosis not present

## 2019-11-12 DIAGNOSIS — K625 Hemorrhage of anus and rectum: Secondary | ICD-10-CM | POA: Diagnosis not present

## 2019-12-10 DIAGNOSIS — E7800 Pure hypercholesterolemia, unspecified: Secondary | ICD-10-CM | POA: Diagnosis not present

## 2019-12-10 DIAGNOSIS — Z23 Encounter for immunization: Secondary | ICD-10-CM | POA: Diagnosis not present

## 2019-12-10 DIAGNOSIS — G40909 Epilepsy, unspecified, not intractable, without status epilepticus: Secondary | ICD-10-CM | POA: Diagnosis not present

## 2019-12-10 DIAGNOSIS — M818 Other osteoporosis without current pathological fracture: Secondary | ICD-10-CM | POA: Diagnosis not present

## 2019-12-10 DIAGNOSIS — Z1389 Encounter for screening for other disorder: Secondary | ICD-10-CM | POA: Diagnosis not present

## 2019-12-10 DIAGNOSIS — R195 Other fecal abnormalities: Secondary | ICD-10-CM | POA: Diagnosis not present

## 2019-12-10 DIAGNOSIS — E559 Vitamin D deficiency, unspecified: Secondary | ICD-10-CM | POA: Diagnosis not present

## 2019-12-10 DIAGNOSIS — Z0000 Encounter for general adult medical examination without abnormal findings: Secondary | ICD-10-CM | POA: Diagnosis not present

## 2019-12-16 ENCOUNTER — Other Ambulatory Visit: Payer: Self-pay | Admitting: Internal Medicine

## 2019-12-16 DIAGNOSIS — R921 Mammographic calcification found on diagnostic imaging of breast: Secondary | ICD-10-CM

## 2020-01-03 DIAGNOSIS — K5900 Constipation, unspecified: Secondary | ICD-10-CM | POA: Diagnosis not present

## 2020-01-03 DIAGNOSIS — Z1211 Encounter for screening for malignant neoplasm of colon: Secondary | ICD-10-CM | POA: Diagnosis not present

## 2020-01-03 DIAGNOSIS — R195 Other fecal abnormalities: Secondary | ICD-10-CM | POA: Diagnosis not present

## 2020-03-02 DIAGNOSIS — M549 Dorsalgia, unspecified: Secondary | ICD-10-CM | POA: Diagnosis not present

## 2020-03-24 DIAGNOSIS — Z1159 Encounter for screening for other viral diseases: Secondary | ICD-10-CM | POA: Diagnosis not present

## 2020-04-10 DIAGNOSIS — Z01812 Encounter for preprocedural laboratory examination: Secondary | ICD-10-CM | POA: Diagnosis not present

## 2020-04-13 DIAGNOSIS — D128 Benign neoplasm of rectum: Secondary | ICD-10-CM | POA: Diagnosis not present

## 2020-04-13 DIAGNOSIS — D125 Benign neoplasm of sigmoid colon: Secondary | ICD-10-CM | POA: Diagnosis not present

## 2020-04-13 DIAGNOSIS — K648 Other hemorrhoids: Secondary | ICD-10-CM | POA: Diagnosis not present

## 2020-04-13 DIAGNOSIS — K625 Hemorrhage of anus and rectum: Secondary | ICD-10-CM | POA: Diagnosis not present

## 2020-04-13 DIAGNOSIS — D122 Benign neoplasm of ascending colon: Secondary | ICD-10-CM | POA: Diagnosis not present

## 2020-04-13 DIAGNOSIS — D120 Benign neoplasm of cecum: Secondary | ICD-10-CM | POA: Diagnosis not present

## 2020-04-13 DIAGNOSIS — R195 Other fecal abnormalities: Secondary | ICD-10-CM | POA: Diagnosis not present

## 2020-04-13 DIAGNOSIS — D124 Benign neoplasm of descending colon: Secondary | ICD-10-CM | POA: Diagnosis not present

## 2020-04-13 DIAGNOSIS — D123 Benign neoplasm of transverse colon: Secondary | ICD-10-CM | POA: Diagnosis not present

## 2020-04-17 DIAGNOSIS — D125 Benign neoplasm of sigmoid colon: Secondary | ICD-10-CM | POA: Diagnosis not present

## 2020-04-17 DIAGNOSIS — D128 Benign neoplasm of rectum: Secondary | ICD-10-CM | POA: Diagnosis not present

## 2020-04-17 DIAGNOSIS — D124 Benign neoplasm of descending colon: Secondary | ICD-10-CM | POA: Diagnosis not present

## 2020-04-17 DIAGNOSIS — D122 Benign neoplasm of ascending colon: Secondary | ICD-10-CM | POA: Diagnosis not present

## 2020-04-17 DIAGNOSIS — D123 Benign neoplasm of transverse colon: Secondary | ICD-10-CM | POA: Diagnosis not present

## 2020-04-17 DIAGNOSIS — D120 Benign neoplasm of cecum: Secondary | ICD-10-CM | POA: Diagnosis not present

## 2020-11-17 DIAGNOSIS — Z01818 Encounter for other preprocedural examination: Secondary | ICD-10-CM | POA: Diagnosis not present

## 2020-11-17 DIAGNOSIS — H25811 Combined forms of age-related cataract, right eye: Secondary | ICD-10-CM | POA: Diagnosis not present

## 2020-11-17 DIAGNOSIS — H18523 Epithelial (juvenile) corneal dystrophy, bilateral: Secondary | ICD-10-CM | POA: Diagnosis not present

## 2020-11-17 DIAGNOSIS — H25812 Combined forms of age-related cataract, left eye: Secondary | ICD-10-CM | POA: Diagnosis not present

## 2020-11-30 DIAGNOSIS — H25812 Combined forms of age-related cataract, left eye: Secondary | ICD-10-CM | POA: Diagnosis not present

## 2020-11-30 DIAGNOSIS — H2513 Age-related nuclear cataract, bilateral: Secondary | ICD-10-CM | POA: Diagnosis not present

## 2020-11-30 DIAGNOSIS — H2512 Age-related nuclear cataract, left eye: Secondary | ICD-10-CM | POA: Diagnosis not present

## 2020-12-21 DIAGNOSIS — H2513 Age-related nuclear cataract, bilateral: Secondary | ICD-10-CM | POA: Diagnosis not present

## 2020-12-21 DIAGNOSIS — H25811 Combined forms of age-related cataract, right eye: Secondary | ICD-10-CM | POA: Diagnosis not present

## 2020-12-21 DIAGNOSIS — H2511 Age-related nuclear cataract, right eye: Secondary | ICD-10-CM | POA: Diagnosis not present

## 2020-12-28 DIAGNOSIS — M818 Other osteoporosis without current pathological fracture: Secondary | ICD-10-CM | POA: Diagnosis not present

## 2020-12-28 DIAGNOSIS — L659 Nonscarring hair loss, unspecified: Secondary | ICD-10-CM | POA: Diagnosis not present

## 2020-12-28 DIAGNOSIS — Z23 Encounter for immunization: Secondary | ICD-10-CM | POA: Diagnosis not present

## 2020-12-28 DIAGNOSIS — E559 Vitamin D deficiency, unspecified: Secondary | ICD-10-CM | POA: Diagnosis not present

## 2020-12-28 DIAGNOSIS — Z1389 Encounter for screening for other disorder: Secondary | ICD-10-CM | POA: Diagnosis not present

## 2020-12-28 DIAGNOSIS — E7800 Pure hypercholesterolemia, unspecified: Secondary | ICD-10-CM | POA: Diagnosis not present

## 2020-12-28 DIAGNOSIS — G40909 Epilepsy, unspecified, not intractable, without status epilepticus: Secondary | ICD-10-CM | POA: Diagnosis not present

## 2020-12-28 DIAGNOSIS — Z0000 Encounter for general adult medical examination without abnormal findings: Secondary | ICD-10-CM | POA: Diagnosis not present

## 2020-12-28 DIAGNOSIS — Z1231 Encounter for screening mammogram for malignant neoplasm of breast: Secondary | ICD-10-CM | POA: Diagnosis not present

## 2021-07-11 ENCOUNTER — Ambulatory Visit (INDEPENDENT_AMBULATORY_CARE_PROVIDER_SITE_OTHER): Payer: Medicare HMO

## 2021-07-11 ENCOUNTER — Encounter: Payer: Self-pay | Admitting: Podiatry

## 2021-07-11 ENCOUNTER — Ambulatory Visit: Payer: Medicare HMO | Admitting: Podiatry

## 2021-07-11 ENCOUNTER — Telehealth: Payer: Self-pay | Admitting: *Deleted

## 2021-07-11 DIAGNOSIS — M79672 Pain in left foot: Secondary | ICD-10-CM

## 2021-07-11 DIAGNOSIS — L600 Ingrowing nail: Secondary | ICD-10-CM

## 2021-07-11 DIAGNOSIS — B070 Plantar wart: Secondary | ICD-10-CM | POA: Diagnosis not present

## 2021-07-11 DIAGNOSIS — L859 Epidermal thickening, unspecified: Secondary | ICD-10-CM | POA: Diagnosis not present

## 2021-07-11 NOTE — Telephone Encounter (Signed)
Patient is wanting to know how many days to soak the area after wart removal(does she have to keep soaking until next appointment?(Long time). Please advise. ?

## 2021-07-11 NOTE — Patient Instructions (Addendum)
Wart Surgery-Directions for Home Care  You will need: Dial antibacterial hand soap,  gauze,  Band-aids  Keep the original bandage on until the following morning.  Bathe or shower with the bandage on allowing it to soak, so that when removed it won't stick to the wound. After showering or bathing, remove the old bandage and cleanse the area with Dial soap and water.  Place a few drops of Dial soap and water on a piece of guaze and gently scrub the area.  Dry with a clean piece of gauze. Apply antibiotic cream (polysporin, triple antibiotic or similar) to the area and place a clean square gauze bandage over and cover with a band-aid. In the evening, add a few drops of Dial soap to a basin of lukewarm water and soak your foot for 15 minutes.  After soaking, follow the instructions above for cleaning the area. Continue cleansing the area as described above two times a day, applying sterile gauze dressings until the doctor informs you that it is not needed. The time required to heal the surgical site will depend upon the size and location of the wart.  Lesions under bony prominences heal slower.  The average healing time is 2 to 4 weeks. Take over the counter Ibuprofen or Tylenol as needed should you experience any discomfort If you do experience discomfort after surgery, keep the foot elevated and apply an ice pack over your ankle, 30 minutes on, 30 minutes off each hour for the rest of the day. If you have any questions , please do not hesitate to contact the office.  WARTS (Verrucae)  Warts are caused by a virus that has invaded the skin.  They are more common in young adults and children and a small percentage will resolve on their own.  There are many types of warts including mosaic warts (large flat), vulgaris (domed warts-have pearl like appearance), and plantar warts (flat or cauliflower like appearance).  Warts are highly contagious and may be picked up from any surface.  Warts thrive in a warm  moist environment and are common near pools, showers, and locker room floors.  Any microscopic cut in the skin is where the virus enters and becomes a wart.  Warts are very difficult to treat and get rid of.  Patience is necessary in the treatment of this virus.  It may take months to cure and different methods may have to be used to get rid of your wart.  Standard Initial Treatment is: Periodic debridement of the wart and application of Canthacur to each lesion (a blistering agent that will slough off the warty skin) Dispensing of topical treatments/prescriptions to apply to the wart at home  Other options include: Excision of the lesion-numbing the skin around the wart and cutting it out-requires daily soaks post-operatively and takes about 2-3 weeks to fully heal Excision with CO2 Laser-Performed at the surgical center your foot is numbed up and the lesions are all cut out and then lasered with a high power laser.  Very good for multiple warts that are resistant. Cimetidine (Tagamet)-Oral agent used in high does--has shown better results in children  How do I apply the standard topical treatments?  Salicylic Acid (Compound W wart remover liquid or gel-available at drug or grocery stores)-Apply a dime size thickness over the wart and cover with duct tape-apply at night so the medication does not spread out to the good skin.  The skin will turn white and slowly blister off.  Use a   pumice stone daily to remove the white skin as best you can.  If the skin gets too raw and painful, discontinue for a few days then resume. Aldara (Imiquimod)-this is an immune response modifier.  They come in little packets so try to get at least 2 days out of each packet if you can.  Apply a small amount to the lesion and cover with duct tape.  Do not rub it in-let it absorb on its own.  Good to apply each morning.  Other Helpful Hints: Wash shoes that can be washed in the washing machine 2-3 x per month with some  bleach Use Lysol in shoes that cannot be washed and wipe out with a cloth 1 x per week-allow to dry for 8 hours before wearing again Use a bleach solution (1 part bleach to 3 parts water) in your tub or shower to reduce the spread of the virus to yourself and others Use aqua socks or clean sandals when at the pool or locker room to reduce the chance of picking up the virus or spreading it to others  

## 2021-07-12 NOTE — Telephone Encounter (Addendum)
Patient is wanting to know if she should rinse the wound after soaking in dial soap. Not mentioned in instructions. ?Returned the call to patient, no answer , left vmessage that she can rinse the area before applying clean gauze. ?

## 2021-07-12 NOTE — Progress Notes (Signed)
Subjective:  ? ?Patient ID: Catherine Ferguson, female   DOB: 71 y.o.   MRN: [id number]  ? ?HPI ?Patient presents stating she has had a very painful lesion underneath her left foot that is been there for several years and she thought she had a wart in a different spot in the past and also is developing more problems with the second nail right stating it is thickened dystrophic she cannot cut it and it is becoming painful.  Patient smokes periodically and is not currently active ? ? ?Review of Systems  ?All other systems reviewed and are negative. ? ? ?   ?Objective:  ?Physical Exam ?Vitals and nursing note reviewed.  ?Constitutional:   ?   Appearance: She is well-developed.  ?Pulmonary:  ?   Effort: Pulmonary effort is normal.  ?Musculoskeletal:     ?   General: Normal range of motion.  ?Skin: ?   General: Skin is warm.  ?Neurological:  ?   Mental Status: She is alert.  ?  ?Neurovascular status was found to be intact muscle strength was found to be adequate range of motion is adequate.  Patient is found to have a lesion at the fifth metatarsal base left that is slightly discolored and measures about 1.2 cm x 1 cm.  It is painful direct pressure and lateral pressure and does not have a defined base to it.  The second nail right is thickened dystrophic and painful ? ?   ?Assessment:  ?Unknown soft tissue mass plantar left could be a verruca but I cannot rule out any other form of pathology with right showing a damaged second nail ? ?   ?Plan:  ?H&P conditions reviewed and discussed.  I am uncomfortable with a 2-year history of this lesion and the discomfort so I rather go ahead and remove it and send it off for pathology and I went ahead and anesthetized 60 mg Xylocaine with epinephrine sterile prep done to the area I excised the lesion in toto and I sent for pathological evaluation.  I then applied sterile dressing instructed on soaks discussed removal of the second nail right which we may do but I want to make  sure this heals first and we will see her back again in 1 month to review the findings or I will call earlier if I see anything abnormal on the findings ?   ? ? ?

## 2021-08-08 ENCOUNTER — Ambulatory Visit (INDEPENDENT_AMBULATORY_CARE_PROVIDER_SITE_OTHER): Payer: Medicare HMO | Admitting: Podiatry

## 2021-08-08 ENCOUNTER — Encounter: Payer: Self-pay | Admitting: Podiatry

## 2021-08-08 DIAGNOSIS — B070 Plantar wart: Secondary | ICD-10-CM

## 2021-08-08 DIAGNOSIS — L600 Ingrowing nail: Secondary | ICD-10-CM

## 2021-08-08 NOTE — Progress Notes (Signed)
Subjective:   Patient ID: Catherine Ferguson, female   DOB: 71 y.o.   MRN: [id number]   HPI Patient states doing great with the bottom of the left foot which did come back as wart and wants the second nail right foot removed   ROS      Objective:  Physical Exam  Neurovascular status intact left plantar foot healed beautifully from verruca removal with right second nail severely damaged and abnormally growing     Assessment:  Chronic nail thickness and pain second right along with probable recovery verruca plantaris left     Plan:  Reviewed the right and I recommended nail removal of a permanent nature and I did explain procedure risk and patient wants procedure.  At this point I went ahead and I anesthetized 60 mg Xylocaine Marcaine mixture I went ahead remove the second nail exposed matrix applied phenol 5 applications 30 seconds followed by alcohol lavage sterile dressing gave instructions for soaks and reappoint for [place] to recheck encouraging her to call with questions concerns which may arise

## 2021-08-08 NOTE — Patient Instructions (Signed)

## 2021-08-10 ENCOUNTER — Encounter: Payer: Self-pay | Admitting: Podiatry

## 2021-08-15 ENCOUNTER — Encounter: Payer: Self-pay | Admitting: Podiatry

## 2021-08-15 ENCOUNTER — Ambulatory Visit (INDEPENDENT_AMBULATORY_CARE_PROVIDER_SITE_OTHER): Payer: Medicare HMO | Admitting: Podiatry

## 2021-08-15 DIAGNOSIS — L03031 Cellulitis of right toe: Secondary | ICD-10-CM

## 2021-08-15 MED ORDER — DOXYCYCLINE HYCLATE 100 MG PO TABS: 100.0000 mg | ORAL_TABLET | Freq: Two times a day (BID) | ORAL | 0 refills | Status: AC

## 2021-08-15 NOTE — Progress Notes (Signed)
Subjective:   Patient ID: Catherine Ferguson, female   DOB: 71 y.o.   MRN: [id number]   HPI Patient presents concerned about some redness in the right second nail stating that she has been soaking but did have some blisters around the toe   ROS      Objective:  Physical Exam  Neurovascular status intact satisfactory removal second nailbed right that there is some redness around it localized     Assessment:  Probable for low-grade paronychia infection right second toe     Plan:  Reviewed condition continue soaks continue bandage usage and went ahead as precautionary measure placed on doxycycline twice daily and gave strict instructions if further redness erythema edema or drainage were to occur to let us know immediately.  Should heal uneventfully with antibiotic

## 2021-09-13 ENCOUNTER — Encounter: Payer: Self-pay | Admitting: Podiatry

## 2021-09-13 ENCOUNTER — Ambulatory Visit: Payer: Medicare HMO | Admitting: Podiatry

## 2021-09-13 DIAGNOSIS — L03031 Cellulitis of right toe: Secondary | ICD-10-CM

## 2021-09-13 NOTE — Progress Notes (Signed)
Subjective:   Patient ID: Catherine Ferguson, female   DOB: 71 y.o.   MRN: [id number]   HPI Patient wanted to get the second digit checked again before she moves out of town and want to make sure was doing okay   ROS      Objective:  Physical Exam  Crusted tissue dorsal second nailbed right that is localized with no proximal edema erythema or drainage noted currently     Assessment:  Possible low-grade paronychia right second toe but it appears to be healing uneventfully with crusted tissue     Plan:  Reviewed condition discussed allowing this tissue to fall off naturally and it should heal uneventfully and if any increased redness or drainage or pain were to occur we will put her back on antibiotics but I want to hold currently and she can just continue soaks

## 2021-09-19 DIAGNOSIS — H903 Sensorineural hearing loss, bilateral: Secondary | ICD-10-CM | POA: Diagnosis not present

## 2021-12-03 DIAGNOSIS — E785 Hyperlipidemia, unspecified: Secondary | ICD-10-CM | POA: Diagnosis not present

## 2021-12-03 DIAGNOSIS — G40909 Epilepsy, unspecified, not intractable, without status epilepticus: Secondary | ICD-10-CM | POA: Diagnosis not present

## 2021-12-03 DIAGNOSIS — H9190 Unspecified hearing loss, unspecified ear: Secondary | ICD-10-CM | POA: Diagnosis not present

## 2021-12-03 DIAGNOSIS — Z0000 Encounter for general adult medical examination without abnormal findings: Secondary | ICD-10-CM | POA: Diagnosis not present

## 2021-12-03 DIAGNOSIS — Z7689 Persons encountering health services in other specified circumstances: Secondary | ICD-10-CM | POA: Diagnosis not present

## 2021-12-03 DIAGNOSIS — M810 Age-related osteoporosis without current pathological fracture: Secondary | ICD-10-CM | POA: Diagnosis not present

## 2021-12-03 DIAGNOSIS — Z720 Tobacco use: Secondary | ICD-10-CM | POA: Diagnosis not present

## 2021-12-03 DIAGNOSIS — Z79899 Other long term (current) drug therapy: Secondary | ICD-10-CM | POA: Diagnosis not present

## 2021-12-03 DIAGNOSIS — Z8543 Personal history of malignant neoplasm of ovary: Secondary | ICD-10-CM | POA: Diagnosis not present

## 2021-12-03 DIAGNOSIS — Z90710 Acquired absence of both cervix and uterus: Secondary | ICD-10-CM | POA: Diagnosis not present
